# Patient Record
Sex: Male | Born: 1965 | Race: White | Hispanic: No | Smoking: Current every day smoker
Health system: Southern US, Community
[De-identification: ages and names within clinical notes are randomized; demographics above are authoritative.]

## PROBLEM LIST (undated history)

## (undated) HISTORY — PX: KNEE ARTHROSCOPY: SHX127

---

## 2012-09-01 ENCOUNTER — Ambulatory Visit: Payer: Self-pay

## 2014-05-16 IMAGING — CR ORBITS FOR FOREIGN BODY - 2 VIEW
1 series · 3 of 3 positions shown · non-contrast
Comparison: none

REASON FOR EXAM: hx of metallic fb in eyes; eval for residual metal prior
to mri
COMMENTS:

PROCEDURE:     DXR - DXR ORBITS FOR MRI CLEARANCE  - September 01, 2012  [DATE]
RESULT:     Comparison: None.

[Series 1: w waters pa · 0.14mm/px · 3 of 3 slices shown]
[im 1/3]
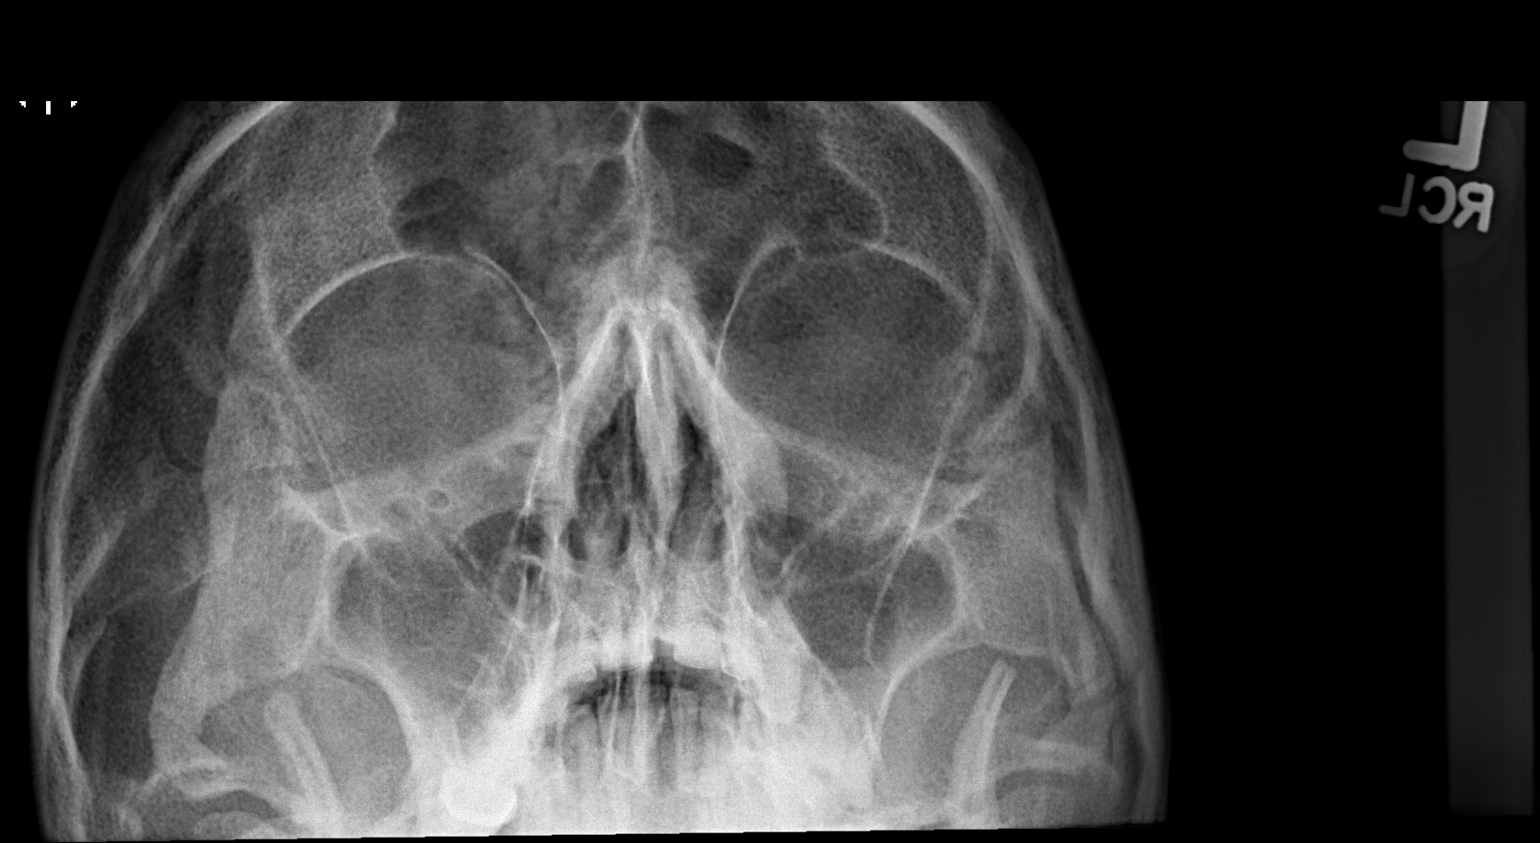
[im 2/3]
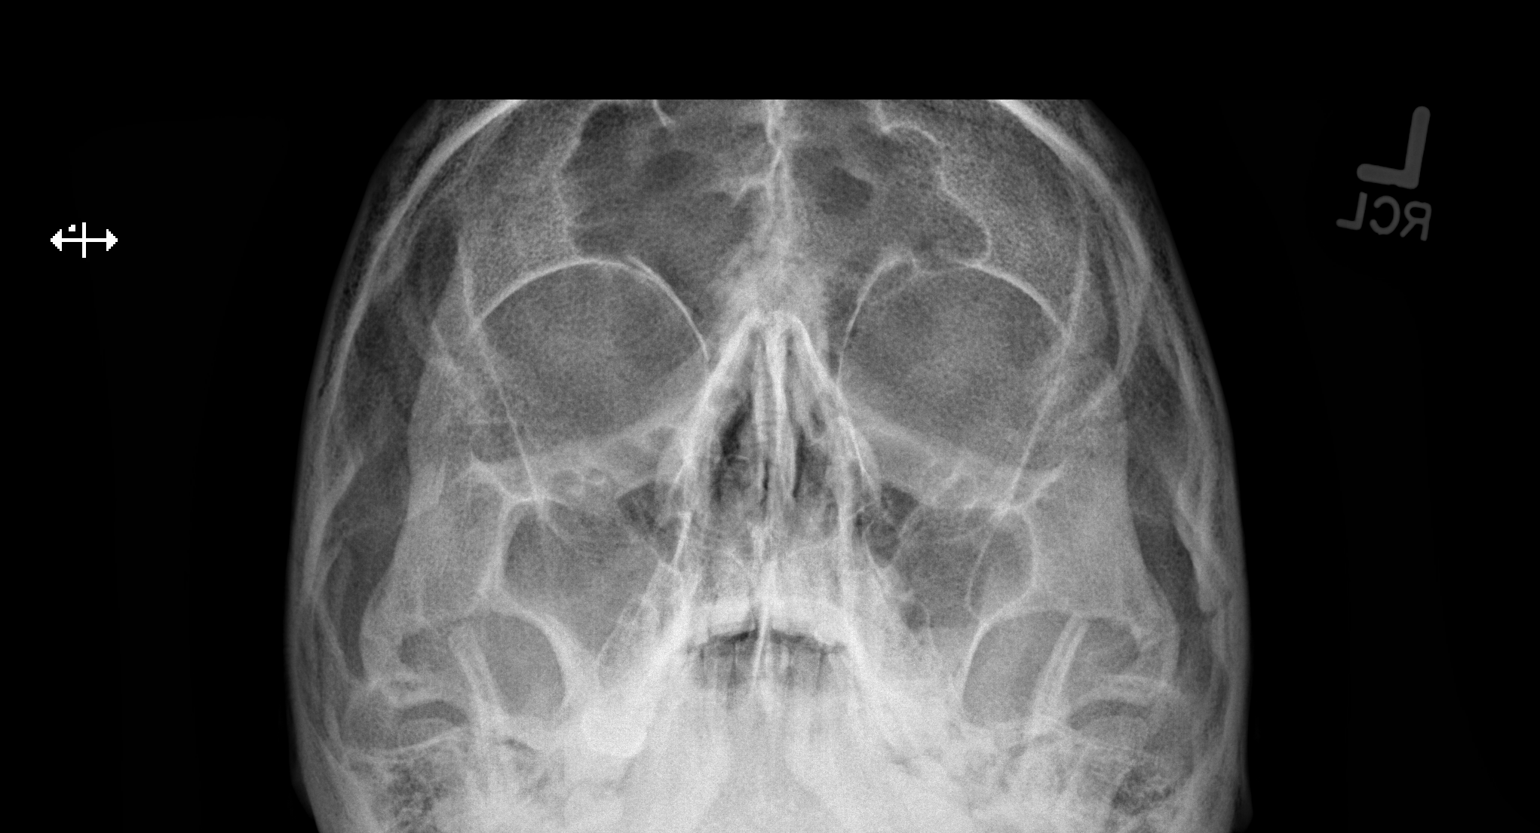
[im 3/3]
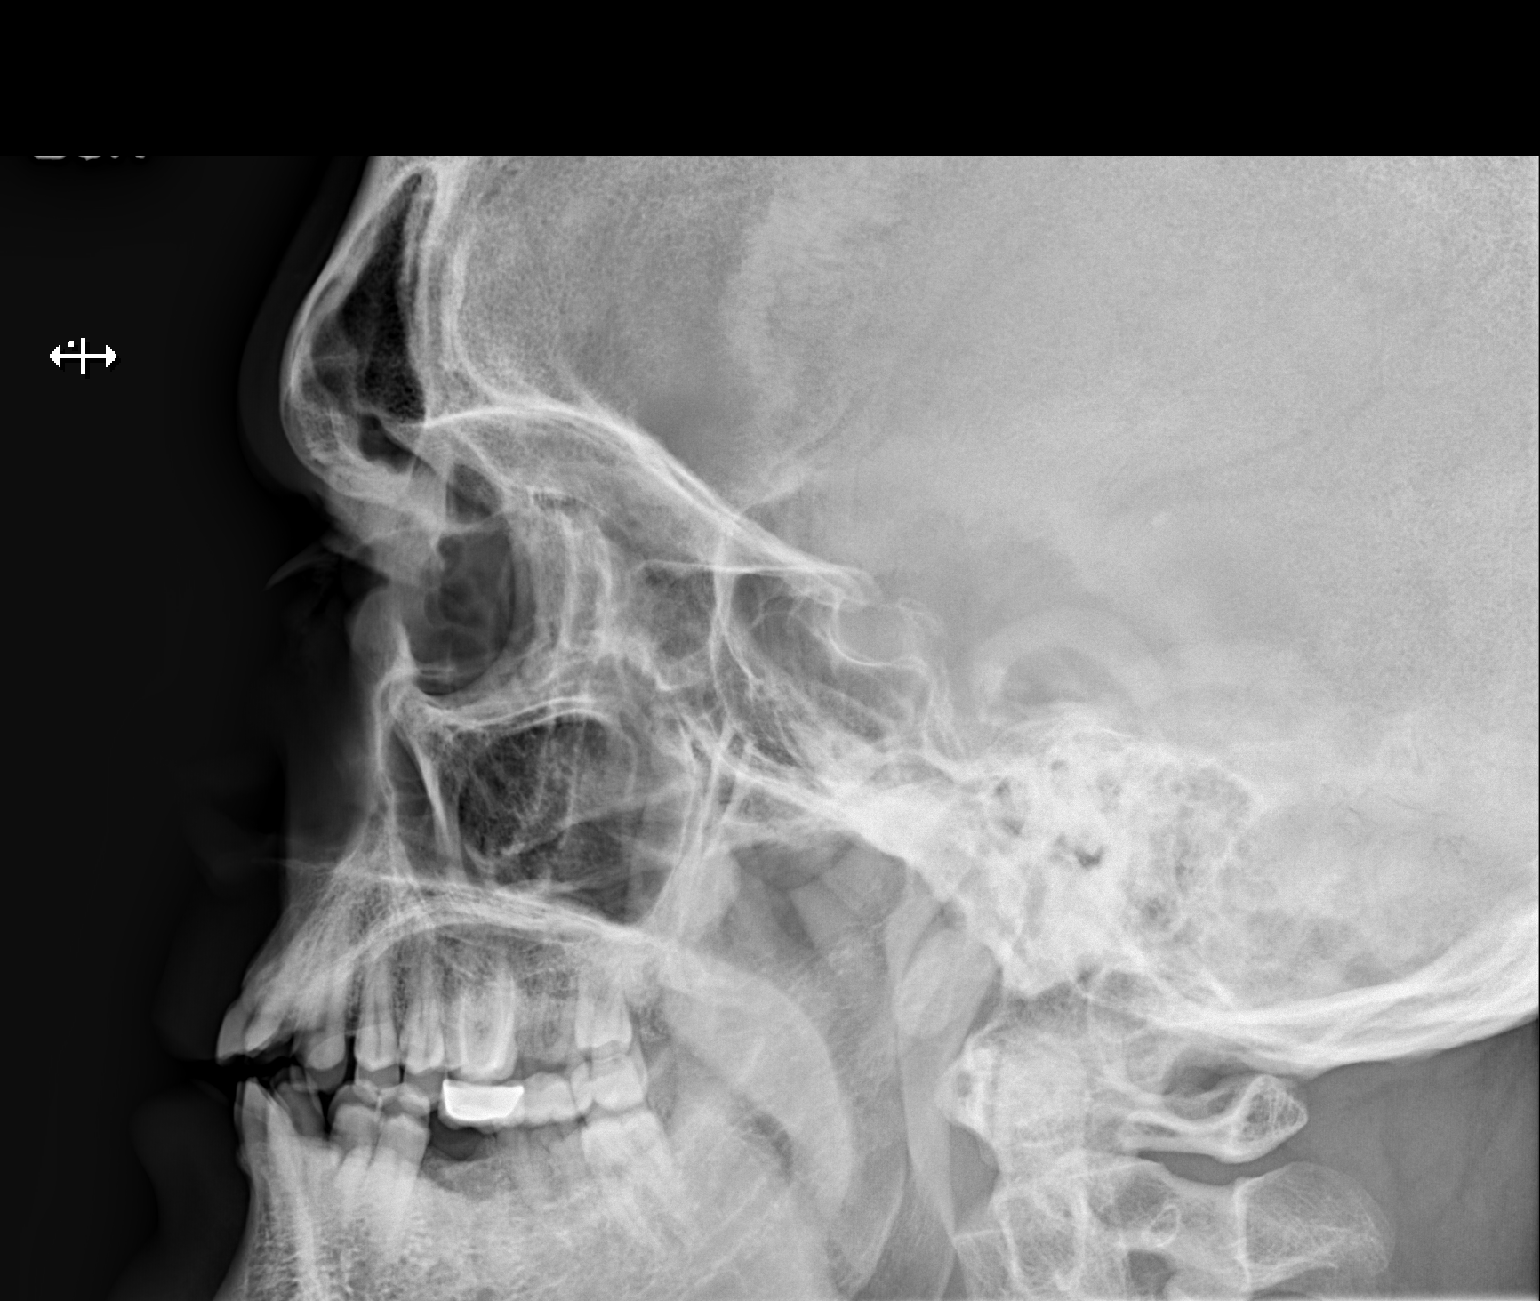

[3 of 3 positions shown; findings below may reference images not displayed]

FINDINGS: No metallic objects identified. No air-fluid levels within the
paranasal sinuses.
IMPRESSION: Please see above.

[REDACTED]

## 2021-05-06 DIAGNOSIS — L309 Dermatitis, unspecified: Secondary | ICD-10-CM | POA: Diagnosis not present

## 2021-05-06 DIAGNOSIS — Z13220 Encounter for screening for lipoid disorders: Secondary | ICD-10-CM | POA: Diagnosis not present

## 2021-05-06 DIAGNOSIS — Z125 Encounter for screening for malignant neoplasm of prostate: Secondary | ICD-10-CM | POA: Diagnosis not present

## 2021-05-06 DIAGNOSIS — R1310 Dysphagia, unspecified: Secondary | ICD-10-CM | POA: Diagnosis not present

## 2021-07-07 DIAGNOSIS — Z1211 Encounter for screening for malignant neoplasm of colon: Secondary | ICD-10-CM | POA: Diagnosis not present

## 2021-07-07 DIAGNOSIS — R634 Abnormal weight loss: Secondary | ICD-10-CM | POA: Diagnosis not present

## 2021-07-07 DIAGNOSIS — R1319 Other dysphagia: Secondary | ICD-10-CM | POA: Diagnosis not present

## 2021-10-14 ENCOUNTER — Ambulatory Visit
Admission: RE | Admit: 2021-10-14 | Discharge: 2021-10-14 | Disposition: A | Discharge status: Home / Self Care | Payer: 59 | Attending: Internal Medicine | Admitting: Internal Medicine

## 2021-10-14 ENCOUNTER — Ambulatory Visit: Payer: 59 | Admitting: Certified Registered"

## 2021-10-14 ENCOUNTER — Encounter
Admission: RE | Disposition: A | Discharge status: Home / Self Care | Payer: Self-pay | Source: Home / Self Care | Attending: Internal Medicine

## 2021-10-14 ENCOUNTER — Encounter: Payer: Self-pay | Admitting: Internal Medicine

## 2021-10-14 DIAGNOSIS — R634 Abnormal weight loss: Secondary | ICD-10-CM | POA: Diagnosis not present

## 2021-10-14 DIAGNOSIS — F17210 Nicotine dependence, cigarettes, uncomplicated: Secondary | ICD-10-CM | POA: Insufficient documentation

## 2021-10-14 DIAGNOSIS — K640 First degree hemorrhoids: Secondary | ICD-10-CM | POA: Diagnosis not present

## 2021-10-14 DIAGNOSIS — K222 Esophageal obstruction: Secondary | ICD-10-CM | POA: Insufficient documentation

## 2021-10-14 DIAGNOSIS — Z1211 Encounter for screening for malignant neoplasm of colon: Secondary | ICD-10-CM | POA: Insufficient documentation

## 2021-10-14 DIAGNOSIS — Z79899 Other long term (current) drug therapy: Secondary | ICD-10-CM | POA: Diagnosis not present

## 2021-10-14 DIAGNOSIS — R1314 Dysphagia, pharyngoesophageal phase: Secondary | ICD-10-CM | POA: Diagnosis not present

## 2021-10-14 HISTORY — PX: COLONOSCOPY: SHX5424

## 2021-10-14 HISTORY — PX: ESOPHAGOGASTRODUODENOSCOPY: SHX5428

## 2021-10-14 SURGERY — COLONOSCOPY
Anesthesia: General

## 2021-10-14 MED ORDER — DEXMEDETOMIDINE HCL IN NACL 200 MCG/50ML IV SOLN
INTRAVENOUS | Status: DC | PRN
  Administered 2021-10-14: 12 ug via INTRAVENOUS

## 2021-10-14 MED ORDER — GLYCOPYRROLATE 0.2 MG/ML IJ SOLN
INTRAMUSCULAR | Status: DC | PRN
  Administered 2021-10-14 (×2): .2 mg via INTRAVENOUS

## 2021-10-14 MED ORDER — PROPOFOL 10 MG/ML IV BOLUS
INTRAVENOUS | Status: DC | PRN
  Administered 2021-10-14 (×3): 30 mg via INTRAVENOUS
  Administered 2021-10-14: 70 mg via INTRAVENOUS

## 2021-10-14 MED ORDER — MIDAZOLAM HCL 2 MG/2ML IJ SOLN
INTRAMUSCULAR | Status: AC
  Filled 2021-10-14: qty 2

## 2021-10-14 MED ORDER — SODIUM CHLORIDE 0.9 % IV SOLN
INTRAVENOUS | Status: DC
  Administered 2021-10-14: 1000 mL via INTRAVENOUS

## 2021-10-14 MED ORDER — MIDAZOLAM HCL 2 MG/2ML IJ SOLN
INTRAMUSCULAR | Status: DC | PRN
  Administered 2021-10-14: 2 mg via INTRAVENOUS

## 2021-10-14 MED ORDER — PROPOFOL 500 MG/50ML IV EMUL
INTRAVENOUS | Status: DC | PRN
  Administered 2021-10-14: 155 ug/kg/min via INTRAVENOUS

## 2021-10-14 MED ORDER — LIDOCAINE HCL (CARDIAC) PF 100 MG/5ML IV SOSY
PREFILLED_SYRINGE | INTRAVENOUS | Status: DC | PRN
  Administered 2021-10-14: 100 mg via INTRAVENOUS

## 2021-10-15 ENCOUNTER — Encounter: Payer: Self-pay | Admitting: Internal Medicine

## 2021-10-16 LAB — SURGICAL PATHOLOGY

## 2021-11-10 ENCOUNTER — Encounter: Payer: Self-pay | Admitting: Internal Medicine

## 2021-11-11 ENCOUNTER — Encounter: Payer: Self-pay | Admitting: Internal Medicine

## 2021-11-11 ENCOUNTER — Encounter
Admission: RE | Disposition: A | Discharge status: Home / Self Care | Payer: Self-pay | Source: Ambulatory Visit | Attending: Internal Medicine

## 2021-11-11 ENCOUNTER — Ambulatory Visit: Payer: 59 | Admitting: Anesthesiology

## 2021-11-11 ENCOUNTER — Ambulatory Visit
Admission: RE | Admit: 2021-11-11 | Discharge: 2021-11-11 | Disposition: A | Discharge status: Home / Self Care | Payer: 59 | Source: Ambulatory Visit | Attending: Internal Medicine | Admitting: Internal Medicine

## 2021-11-11 DIAGNOSIS — K449 Diaphragmatic hernia without obstruction or gangrene: Secondary | ICD-10-CM | POA: Diagnosis not present

## 2021-11-11 DIAGNOSIS — K222 Esophageal obstruction: Secondary | ICD-10-CM | POA: Diagnosis not present

## 2021-11-11 DIAGNOSIS — R1310 Dysphagia, unspecified: Secondary | ICD-10-CM | POA: Diagnosis present

## 2021-11-11 DIAGNOSIS — K209 Esophagitis, unspecified without bleeding: Secondary | ICD-10-CM | POA: Diagnosis not present

## 2021-11-11 HISTORY — PX: ESOPHAGOGASTRODUODENOSCOPY (EGD) WITH PROPOFOL: SHX5813

## 2021-11-11 SURGERY — ESOPHAGOGASTRODUODENOSCOPY (EGD) WITH PROPOFOL
Anesthesia: General

## 2021-11-11 MED ORDER — SODIUM CHLORIDE 0.9 % IV SOLN
INTRAVENOUS | Status: DC
  Administered 2021-11-11: 1000 mL via INTRAVENOUS

## 2021-11-11 MED ORDER — PROPOFOL 10 MG/ML IV BOLUS
INTRAVENOUS | Status: DC | PRN
  Administered 2021-11-11: 100 mg via INTRAVENOUS
  Administered 2021-11-11: 80 mg via INTRAVENOUS

## 2021-11-11 MED ORDER — PROPOFOL 500 MG/50ML IV EMUL
INTRAVENOUS | Status: DC | PRN
  Administered 2021-11-11: 200 ug/kg/min via INTRAVENOUS

## 2021-11-12 ENCOUNTER — Encounter: Payer: Self-pay | Admitting: Internal Medicine

## 2021-11-16 LAB — SURGICAL PATHOLOGY

## 2022-02-05 ENCOUNTER — Encounter: Payer: Self-pay | Admitting: Internal Medicine

## 2022-02-10 ENCOUNTER — Ambulatory Visit: Payer: 59 | Admitting: Registered Nurse

## 2022-02-10 ENCOUNTER — Encounter
Admission: RE | Disposition: A | Discharge status: Home / Self Care | Payer: Self-pay | Source: Ambulatory Visit | Attending: Internal Medicine

## 2022-02-10 ENCOUNTER — Ambulatory Visit
Admission: RE | Admit: 2022-02-10 | Discharge: 2022-02-10 | Disposition: A | Discharge status: Home / Self Care | Payer: 59 | Source: Ambulatory Visit | Attending: Internal Medicine | Admitting: Internal Medicine

## 2022-02-10 ENCOUNTER — Other Ambulatory Visit: Payer: Self-pay

## 2022-02-10 ENCOUNTER — Encounter: Payer: Self-pay | Admitting: Internal Medicine

## 2022-02-10 DIAGNOSIS — K449 Diaphragmatic hernia without obstruction or gangrene: Secondary | ICD-10-CM | POA: Insufficient documentation

## 2022-02-10 DIAGNOSIS — K3189 Other diseases of stomach and duodenum: Secondary | ICD-10-CM | POA: Insufficient documentation

## 2022-02-10 DIAGNOSIS — K2100 Gastro-esophageal reflux disease with esophagitis, without bleeding: Secondary | ICD-10-CM | POA: Insufficient documentation

## 2022-02-10 DIAGNOSIS — K222 Esophageal obstruction: Secondary | ICD-10-CM | POA: Insufficient documentation

## 2022-02-10 DIAGNOSIS — R1310 Dysphagia, unspecified: Secondary | ICD-10-CM | POA: Diagnosis present

## 2022-02-10 DIAGNOSIS — F17200 Nicotine dependence, unspecified, uncomplicated: Secondary | ICD-10-CM | POA: Diagnosis not present

## 2022-02-10 HISTORY — PX: ESOPHAGOGASTRODUODENOSCOPY (EGD) WITH PROPOFOL: SHX5813

## 2022-02-10 SURGERY — ESOPHAGOGASTRODUODENOSCOPY (EGD) WITH PROPOFOL
Anesthesia: General

## 2022-02-10 MED ORDER — PROPOFOL 500 MG/50ML IV EMUL
INTRAVENOUS | Status: DC | PRN
  Administered 2022-02-10: 205.714 ug/kg/min via INTRAVENOUS

## 2022-02-10 MED ORDER — GLYCOPYRROLATE 0.2 MG/ML IJ SOLN
INTRAMUSCULAR | Status: AC
  Filled 2022-02-10: qty 1

## 2022-02-10 MED ORDER — LIDOCAINE HCL (PF) 2 % IJ SOLN
INTRAMUSCULAR | Status: AC
  Filled 2022-02-10: qty 40

## 2022-02-10 MED ORDER — PROPOFOL 10 MG/ML IV BOLUS
INTRAVENOUS | Status: DC | PRN
  Administered 2022-02-10: 40 mg via INTRAVENOUS
  Administered 2022-02-10: 90 mg via INTRAVENOUS

## 2022-02-10 MED ORDER — GLYCOPYRROLATE 0.2 MG/ML IJ SOLN
INTRAMUSCULAR | Status: DC | PRN
  Administered 2022-02-10: .2 mg via INTRAVENOUS

## 2022-02-10 MED ORDER — PROPOFOL 1000 MG/100ML IV EMUL
INTRAVENOUS | Status: AC
  Filled 2022-02-10: qty 100

## 2022-02-10 MED ORDER — EPHEDRINE 5 MG/ML INJ
INTRAVENOUS | Status: AC
  Filled 2022-02-10: qty 5

## 2022-02-10 MED ORDER — SODIUM CHLORIDE 0.9 % IV SOLN: INTRAVENOUS | Status: DC

## 2022-02-10 MED ORDER — LIDOCAINE HCL (CARDIAC) PF 100 MG/5ML IV SOSY
PREFILLED_SYRINGE | INTRAVENOUS | Status: DC | PRN
  Administered 2022-02-10: 100 mg via INTRAVENOUS

## 2022-02-10 MED ORDER — DEXMEDETOMIDINE HCL 200 MCG/2ML IV SOLN
INTRAVENOUS | Status: DC | PRN
  Administered 2022-02-10: 16 ug via INTRAVENOUS

## 2022-02-10 MED ORDER — PROPOFOL 1000 MG/100ML IV EMUL
INTRAVENOUS | Status: AC
  Filled 2022-02-10: qty 300

## 2022-02-10 MED ORDER — DEXMEDETOMIDINE HCL IN NACL 80 MCG/20ML IV SOLN
INTRAVENOUS | Status: AC
  Filled 2022-02-10: qty 20

## 2022-02-10 NOTE — H&P (Signed)
Outpatient short stay form Pre-procedure 02/10/2022 8:14 AM Paul Moran Paul Moran, M.D.  Primary Physician: N/A  Reason for visit:  Dysphagia, esophageal stricture  History of present illness:  56 y/o male with history of marked distal esophageal stricture secondary to chronic GERD, presents for repeat esophageal dilation (third) for dysphagia and GERD. He missed an appointment in Oct 2023 secondary to non-coverage and needing to be rescheduled. Patient denies intractable heartburn,  hemetemesis, abdominal pain, nausea or vomiting.      Current Facility-Administered Medications:    0.9 %  sodium chloride infusion, , Intravenous, Continuous, Paul, Boykin Nearing, MD, Last Rate: 20 mL/hr at 02/10/22 0718, New Bag at 02/10/22 0718  No medications prior to admission.     No Known Allergies   History reviewed. No pertinent past medical history.  Review of systems:  Otherwise negative.    Physical Exam  Gen: Alert, oriented. Appears stated age.  HEENT: [place]/AT. PERRLA. Lungs: CTA, no wheezes. CV: RR nl S1, S2. Abd: soft, benign, no masses. BS+ Ext: No edema. Pulses 2+    Planned procedures: Proceed with EGD. The patient understands the nature of the planned procedure, indications, risks, alternatives and potential complications including but not limited to bleeding, infection, perforation, damage to internal organs and possible oversedation/side effects from anesthesia. The patient agrees and gives consent to proceed.  Please refer to procedure notes for findings, recommendations and patient disposition/instructions.     Paul Moran Paul Moran, M.D. Gastroenterology 02/10/2022  8:14 AM

## 2022-02-10 NOTE — Anesthesia Preprocedure Evaluation (Signed)
Anesthesia Evaluation  Patient identified by MRN, date of birth, ID band Patient awake    Reviewed: Allergy & Precautions, NPO status , Patient's Chart, lab work & pertinent test results  History of Anesthesia Complications Negative for: history of anesthetic complications  Airway Mallampati: III  TM Distance: >3 FB Neck ROM: full    Dental  (+) Dental Advidsory Given   Pulmonary neg pulmonary ROS, neg COPD, Current Smoker and Patient abstained from smoking.   Pulmonary exam normal        Cardiovascular (-) Past MI and (-) CABG negative cardio ROS Normal cardiovascular exam     Neuro/Psych negative neurological ROS  negative psych ROS   GI/Hepatic negative GI ROS, Neg liver ROS,,,    Unintentional weight loss     dysphagia   Endo/Other  negative endocrine ROS    Renal/GU negative Renal ROS  negative genitourinary   Musculoskeletal   Abdominal Normal abdominal exam  (+)   Peds  Hematology negative hematology ROS (+)   Anesthesia Other Findings History reviewed. No pertinent past medical history.  Past Surgical History: 10/14/2021: COLONOSCOPY; N/A     Comment:  Procedure: COLONOSCOPY;  Surgeon: Toledo, Boykin Nearing, MD;              Location: [facility] ENDOSCOPY;  Service: Gastroenterology;                Laterality: N/A; 10/14/2021: ESOPHAGOGASTRODUODENOSCOPY; N/A     Comment:  Procedure: ESOPHAGOGASTRODUODENOSCOPY (EGD);  Surgeon:               Toledo, Boykin Nearing, MD;  Location: [facility] ENDOSCOPY;                Service: Gastroenterology;  Laterality: N/A; 11/11/2021: ESOPHAGOGASTRODUODENOSCOPY (EGD) WITH PROPOFOL; N/A     Comment:  Procedure: ESOPHAGOGASTRODUODENOSCOPY (EGD) WITH               PROPOFOL;  Surgeon: Toledo, Boykin Nearing, MD;  Location:               [facility] ENDOSCOPY;  Service: Gastroenterology;  Laterality:               N/A; No date: KNEE ARTHROSCOPY; Left  BMI    Body Mass Index: 26.18 kg/m       Reproductive/Obstetrics negative OB ROS                             Anesthesia Physical Anesthesia Plan  ASA: 2  Anesthesia Plan: General   Post-op Pain Management: Minimal or no pain anticipated   Induction: Intravenous  PONV Risk Score and Plan: 3 and Propofol infusion, TIVA and Ondansetron  Airway Management Planned: Nasal Cannula  Additional Equipment: None  Intra-op Plan:   Post-operative Plan:   Informed Consent: I have reviewed the patients History and Physical, chart, labs and discussed the procedure including the risks, benefits and alternatives for the proposed anesthesia with the patient or authorized representative who has indicated his/her understanding and acceptance.     Dental advisory given  Plan Discussed with: CRNA and Surgeon  Anesthesia Plan Comments: (Discussed risks of anesthesia with patient, including possibility of difficulty with spontaneous ventilation under anesthesia necessitating airway intervention, PONV, and rare risks such as cardiac or respiratory or neurological events, and allergic reactions. Discussed the role of CRNA in patient's perioperative care. Patient understands.)        Anesthesia Quick Evaluation

## 2022-02-10 NOTE — Op Note (Signed)
[facility] Gastroenterology Patient Name: Paul Moran Procedure Date: 02/10/2022 7:19 AM MRN: [id number] Account #: [id number] Date of Birth: 1965/02/20 Admit Type: Outpatient Age: 56 Room: [facility] ENDO ROOM 2 Gender: Male Note Status: Finalized Instrument Name: Upper Endoscope [id number] Procedure:             Upper GI endoscopy Indications:           Dysphagia, Gastro-esophageal reflux disease, Stricture                         of the esophagus Providers:             Boykin Nearing. [name] MD, MD Referring MD:          No Local Md, MD (Referring MD) Medicines:             Propofol per Anesthesia Complications:         No immediate complications. Estimated blood loss:                         Minimal. Procedure:             Pre-Anesthesia Assessment:                        - The risks and benefits of the procedure and the                         sedation options and risks were discussed with the                         patient. All questions were answered and informed                         consent was obtained.                        - Patient identification and proposed procedure were                         verified prior to the procedure by the nurse. The                         procedure was verified in the procedure room.                        - ASA Grade Assessment: III - A patient with severe                         systemic disease.                        - After reviewing the risks and benefits, the patient                         was deemed in satisfactory condition to undergo the                         procedure in an ambulatory setting.  After obtaining informed consent, the endoscope was                         passed under direct vision. Throughout the procedure,                         the patient's blood pressure, pulse, and oxygen                         saturations were monitored continuously. The Endoscope                          was introduced through the mouth, and advanced to the                         third part of duodenum. The upper GI endoscopy was                         accomplished without difficulty. The patient tolerated                         the procedure well. Findings:      LA Grade B (one or more mucosal breaks greater than 5 mm, not extending       between the tops of two mucosal folds) esophagitis with no bleeding was       found in the distal esophagus.      One benign-appearing, intrinsic moderate stenosis was found in the       distal esophagus. This stenosis measured 1.3 cm (inner diameter) x less       than one cm (in length). The stenosis was traversed. A TTS dilator was       passed through the scope. Dilation with a 15-16.5-18 mm balloon dilator       was performed to 15 mm. The dilation site was examined following       endoscope reinsertion and showed moderate mucosal disruption and       moderate improvement in luminal narrowing. Estimated blood loss was       minimal.      A small hiatal hernia was present.      No other significant abnormalities were identified in a careful       examination of the stomach.      Patchy mildly erythematous mucosa without active bleeding and with no       stigmata of bleeding was found in the duodenal bulb.      The second portion of the duodenum and third portion of the duodenum       were normal.      The exam was otherwise without abnormality. Impression:            - LA Grade B reflux esophagitis with no bleeding.                        - Benign-appearing esophageal stenosis. Dilated.                        - Small hiatal hernia.                        - Erythematous duodenopathy.                        -  Normal second portion of the duodenum and third                         portion of the duodenum.                        - The examination was otherwise normal.                        - No specimens collected. Recommendation:        -  Patient has a contact number available for                         emergencies. The signs and symptoms of potential                         delayed complications were discussed with the patient.                         Return to normal activities tomorrow. Written                         discharge instructions were provided to the patient.                        - Resume previous diet.                        - Continue present medications. Procedure Code(s):     --- Professional ---                        [phone], Esophagogastroduodenoscopy, flexible,                         transoral; with transendoscopic balloon dilation of                         esophagus (less than 30 mm diameter) Diagnosis Code(s):     --- Professional ---                        R13.10, Dysphagia, unspecified                        K31.89, Other diseases of stomach and duodenum                        K44.9, Diaphragmatic hernia without obstruction or                         gangrene                        K22.2, Esophageal obstruction                        K21.00, Gastro-esophageal reflux disease with                         esophagitis, without bleeding CPT copyright 2022 American [employer]. All rights reserved. The codes documented in this report are preliminary and upon  coder review may  be revised to meet current compliance requirements. Stanton Kidneyeodoro K Brette Cast MD, MD 02/10/2022 8:49:38 AM This report has been signed electronically. Number of Addenda: 0 Note Initiated On: 02/10/2022 7:19 AM Estimated Blood Loss:  Estimated blood loss was minimal.      [facility]

## 2022-02-10 NOTE — Transfer of Care (Signed)
Immediate Anesthesia Transfer of Care Note  Patient: Paul Moran  Procedure(s) Performed: ESOPHAGOGASTRODUODENOSCOPY (EGD) WITH PROPOFOL  Patient Location: Endoscopy Unit  Anesthesia Type:General  Level of Consciousness: drowsy  Airway & Oxygen Therapy: Patient Spontanous Breathing  Post-op Assessment: Report given to RN and Post -op Vital signs reviewed and stable  Post vital signs: Reviewed and stable  Last Vitals:  Vitals Value Taken Time  BP 111/77 02/10/22 0845  Temp 36.3 C 02/10/22 0845  Pulse 69 02/10/22 0846  Resp 17 02/10/22 0846  SpO2 92 % 02/10/22 0846  Vitals shown include unvalidated device data.  Last Pain:  Vitals:   02/10/22 0845  TempSrc: Tympanic  PainSc: Asleep         Complications: No notable events documented.

## 2022-02-10 NOTE — Anesthesia Postprocedure Evaluation (Signed)
Anesthesia Post Note  Patient: Paul Moran  Procedure(s) Performed: ESOPHAGOGASTRODUODENOSCOPY (EGD) WITH PROPOFOL  Patient location during evaluation: Endoscopy Anesthesia Type: General Level of consciousness: awake and alert Pain management: pain level controlled Vital Signs Assessment: post-procedure vital signs reviewed and stable Respiratory status: spontaneous breathing, nonlabored ventilation, respiratory function stable and patient connected to nasal cannula oxygen Cardiovascular status: blood pressure returned to baseline and stable Postop Assessment: no apparent nausea or vomiting Anesthetic complications: no  No notable events documented.   Last Vitals:  Vitals:   02/10/22 0855 02/10/22 0905  BP: 114/74 128/85  Pulse: (!) 53 (!) 55  Resp: 14 20  Temp:    SpO2: 97% 98%    Last Pain:  Vitals:   02/10/22 0905  TempSrc:   PainSc: 0-No pain                 Stephanie Coup

## 2022-02-10 NOTE — Interval H&P Note (Signed)
History and Physical Interval Note:  02/10/2022 8:17 AM  Paul Moran  has presented today for surgery, with the diagnosis of K22.2 - Esophageal stricture R13.19  - Esophageal dysphagia K20.90  - Esophagitis.  The various methods of treatment have been discussed with the patient and family. After consideration of risks, benefits and other options for treatment, the patient has consented to  Procedure(s): ESOPHAGOGASTRODUODENOSCOPY (EGD) WITH PROPOFOL (N/A) as a surgical intervention.  The patient's history has been reviewed, patient examined, no change in status, stable for surgery.  I have reviewed the patient's chart and labs.  Questions were answered to the patient's satisfaction.     [place], [place]

## 2022-02-11 ENCOUNTER — Encounter: Payer: Self-pay | Admitting: Internal Medicine
